# Patient Record
Sex: Male | Born: 1995 | Race: White | Hispanic: No | Marital: Single | Smoking: Current some day smoker
Health system: Southern US, Community
[De-identification: ages and names within clinical notes are randomized; demographics above are authoritative.]

## PROBLEM LIST (undated history)

## (undated) DIAGNOSIS — K219 Gastro-esophageal reflux disease without esophagitis: Secondary | ICD-10-CM

## (undated) DIAGNOSIS — F419 Anxiety disorder, unspecified: Secondary | ICD-10-CM

## (undated) DIAGNOSIS — F32A Depression, unspecified: Secondary | ICD-10-CM

## (undated) HISTORY — DX: Depression, unspecified: F32.A

## (undated) HISTORY — DX: Gastro-esophageal reflux disease without esophagitis: K21.9

## (undated) HISTORY — DX: Anxiety disorder, unspecified: F41.9

---

## 2015-08-27 ENCOUNTER — Encounter (HOSPITAL_COMMUNITY): Payer: Self-pay | Admitting: *Deleted

## 2015-08-27 ENCOUNTER — Ambulatory Visit (HOSPITAL_COMMUNITY)
Admission: EM | Admit: 2015-08-27 | Discharge: 2015-08-27 | Disposition: A | Discharge status: Home / Self Care | Payer: 59 | Attending: Family Medicine | Admitting: Family Medicine

## 2015-08-27 ENCOUNTER — Ambulatory Visit (INDEPENDENT_AMBULATORY_CARE_PROVIDER_SITE_OTHER): Payer: 59

## 2015-08-27 DIAGNOSIS — S92301A Fracture of unspecified metatarsal bone(s), right foot, initial encounter for closed fracture: Secondary | ICD-10-CM

## 2015-08-27 MED ORDER — HYDROCODONE-ACETAMINOPHEN 5-325 MG PO TABS: 1.0000 | ORAL_TABLET | ORAL | 0 refills | Status: DC | PRN

## 2015-08-27 NOTE — ED Provider Notes (Signed)
CSN: [id number]     Arrival date & time 08/27/15  1844 History   First MD Initiated Contact with Patient 08/27/15 1951     Chief Complaint  Patient presents with  . Foot Injury   (Consider location/radiation/quality/duration/timing/severity/associated sxs/prior Treatment) 20 year old male was on a skateboard yesterday, fell and inverted his right foot. He is complaining of persistent pain with swelling and tenderness along the lateral aspect of the right foot. Denies injury to the ankle. Denies falling or other injury.      History reviewed. No pertinent past medical history. History reviewed. No pertinent surgical history. History reviewed. No pertinent family history. Social History  Substance Use Topics  . Smoking status: Not on file  . Smokeless tobacco: Not on file  . Alcohol use Not on file    Review of Systems  Constitutional: Negative.   Respiratory: Negative.   Gastrointestinal: Negative.   Genitourinary: Negative.   Musculoskeletal:       As per HPI  Skin: Negative.   Neurological: Negative for dizziness, weakness, numbness and headaches.  All other systems reviewed and are negative.   Allergies  Review of patient's allergies indicates no known allergies.  Home Medications   Prior to Admission medications   Medication Sig Start Date End Date Taking? Authorizing Provider  HYDROcodone-acetaminophen (NORCO/VICODIN) 5-325 MG tablet Take 1 tablet by mouth every 4 (four) hours as needed. 08/27/15   Hayden Rasmussenavid Maninder Deboer, NP   Meds Ordered and Administered this Visit  Medications - No data to display  BP 134/62 (BP Location: Left Arm)   Pulse 64   Temp 98.1 F (36.7 C) (Oral)   Resp 12   SpO2 99%  No data found.   Physical Exam  Constitutional: He is oriented to person, place, and time. He appears well-developed and well-nourished.  HENT:  Head: Normocephalic and atraumatic.  Eyes: EOM are normal. Left eye exhibits no discharge.  Neck: Normal range of motion. Neck  supple.  Musculoskeletal:  Mild erythema, swelling and tenderness to the right dorsal lateral foot along the fourth and fifth metatarsals. No deformity. Sensation intact. Pedal pulse 2+. No ankle swelling, tenderness, discoloration or pain. Full range of motion of the ankle.  Neurological: He is alert and oriented to person, place, and time. No cranial nerve deficit.  Skin: Skin is warm and dry.  Psychiatric: He has a normal mood and affect.    Urgent Care Course   Clinical Course    Procedures (including critical care time)  Labs Review Labs Reviewed - No data to display  Imaging Review Dg Foot Complete Right  Result Date: 08/27/2015 CLINICAL DATA:  Right foot injury while skateboarding yesterday with persistent pain, initial encounter EXAM: RIGHT FOOT COMPLETE - 3+ VIEW COMPARISON:  None. FINDINGS: There is a midshaft fracture of the fifth metatarsal with only minimal displacement identified. No other fractures are seen. Associated soft tissue swelling is noted. IMPRESSION: Fifth metatarsal fracture with soft tissue swelling. Electronically Signed   By: Alcide CleverMark  Lukens M.D.   On: 08/27/2015 20:17     Visual Acuity Review  Right Eye Distance:   Left Eye Distance:   Bilateral Distance:    Right Eye Near:   Left Eye Near:    Bilateral Near:         MDM   1. Metatarsal fracture, right, closed, initial encounter    Rest, Ice, elevation, wear wrap and use shoe with ambulation. NO weight bearing. Follow with the orthopedist above. Meds ordered this encounter  Medications  . HYDROcodone-acetaminophen (NORCO/VICODIN) 5-325 MG tablet    Sig: Take 1 tablet by mouth every 4 (four) hours as needed.    Dispense:  15 tablet    Refill:  0    Order Specific Question:   Supervising Provider    Answer:   Linna Hoff [[id number]]       Hayden Rasmussen, NP 08/27/15 2038    Hayden Rasmussen, NP 08/27/15 2041

## 2015-08-27 NOTE — ED Notes (Signed)
Large   Adult  Crutches   And  XL   Male   Advertising account plannerWooden  Shoe

## 2015-08-27 NOTE — ED Triage Notes (Signed)
Pt  Reports yesterday   While  Skateboarding  Pt   Turned  His  r foot  Doing a  Maneuver he  Has  Pain  And swelling  To the affected  r foot  He  Has  Pain  When he  Puts  Weight on the  Foot

## 2015-08-27 NOTE — Discharge Instructions (Signed)
Rest, Ice, elevation, wear wrap and use shoe with ambulation. NO weight bearing. Follow with the orthopedist above.

## 2016-05-29 ENCOUNTER — Ambulatory Visit (INDEPENDENT_AMBULATORY_CARE_PROVIDER_SITE_OTHER): Payer: 59

## 2016-05-29 ENCOUNTER — Encounter (HOSPITAL_COMMUNITY): Payer: Self-pay | Admitting: Emergency Medicine

## 2016-05-29 ENCOUNTER — Ambulatory Visit (HOSPITAL_COMMUNITY)
Admission: EM | Admit: 2016-05-29 | Discharge: 2016-05-29 | Disposition: A | Discharge status: Home / Self Care | Payer: 59 | Attending: Internal Medicine | Admitting: Internal Medicine

## 2016-05-29 DIAGNOSIS — M84374G Stress fracture, right foot, subsequent encounter for fracture with delayed healing: Secondary | ICD-10-CM

## 2016-05-29 MED ORDER — IBUPROFEN 800 MG PO TABS
ORAL_TABLET | ORAL | Status: AC
  Filled 2016-05-29: qty 1

## 2016-05-29 MED ORDER — IBUPROFEN 800 MG PO TABS
800.0000 mg | ORAL_TABLET | Freq: Once | ORAL | Status: AC
  Administered 2016-05-29: 800 mg via ORAL

## 2016-05-29 NOTE — ED Triage Notes (Addendum)
Patient reports injury today.  Patient was skateboarding and injured right lateral foot.  Reports a fracture to this foot in the past and recently sprained or bruised this area.  Able to move toes, right pedal pulse 2+

## 2016-05-29 NOTE — Progress Notes (Signed)
Orthopedic Tech Progress Note Patient Details:  Howard Morris [birth year] [id number]  Ortho Devices Type of Ortho Device: Ace wrap, Post (short leg) splint Ortho Device/Splint Location: RLE Ortho Device/Splint Interventions: Ordered, Application   Jennye MoccasinHughes, Gregoria Selvy Craig 05/29/2016, 7:24 PM

## 2016-05-29 NOTE — Discharge Instructions (Addendum)
Wear splint and use crutches until followup with orthopedist.  Ice, elevate for pain.  Ibuprofen 800mg  2-3 times daily should help with pain.

## 2016-05-29 NOTE — ED Provider Notes (Signed)
[facility]    CSN: [id number] Arrival date & time: 05/29/16  1715     History   Chief Complaint Chief Complaint  Patient presents with  . Foot Pain    HPI Howard Morris is a 21 y.o. male. He broke the fifth metatarsal in his right foot last summer, seemed to mostly heal.  Reinjured the same site about a month ago, [facility] an inversion injury of his right ankle and foot. Swollen and very painful today.    HPI  History reviewed. No pertinent past medical history.  History reviewed. No pertinent surgical history.     Home Medications   Takes no meds regularly  Family History No family history on file.  Social History Social History  Substance Use Topics  . Smoking status: Current Some Day Smoker  . Smokeless tobacco: Not on file  . Alcohol use Yes     Allergies   Patient has no known allergies.   Review of Systems Review of Systems  All other systems reviewed and are negative.    Physical Exam Triage Vital Signs ED Triage Vitals  Enc Vitals Group     BP 05/29/16 1751 136/80     Pulse Rate 05/29/16 1751 (!) 104     Resp 05/29/16 1751 18     Temp 05/29/16 1751 98.9 F (37.2 C)     Temp Source 05/29/16 1751 Oral     SpO2 05/29/16 1751 97 %     Weight --      Height --      Pain Score 05/29/16 1746 7     Pain Loc --    Updated Vital Signs BP 136/80 (BP Location: Right Arm)   Pulse (!) 104   Temp 98.9 F (37.2 C) (Oral)   Resp 18   SpO2 97%   Physical Exam  Constitutional: He is oriented to person, place, and time. No distress.  Alert, nicely groomed  HENT:  Head: Atraumatic.  Eyes:  Conjugate gaze, no eye redness/drainage  Neck: Neck supple.  Cardiovascular: Normal rate.   Pulmonary/Chest: No respiratory distress.  Abdominal: He exhibits no distension.  Musculoskeletal: Normal range of motion.  Right foot dorsal laterally is quite swollen, with focal bony tenderness in the mid fifth  metatarsal area.  Neurological: He is alert and oriented to person, place, and time.  Skin: Skin is warm and dry.  No cyanosis  Nursing note and vitals reviewed.    UC Treatments / Results   Radiology Dg Foot Complete Right  Result Date: 05/29/2016 CLINICAL DATA:  Acute right foot pain following skateboarding injury today. Initial encounter. History of fifth metatarsal fracture in August 2017. EXAM: RIGHT FOOT COMPLETE - 3+ VIEW COMPARISON:  08/27/2015 FINDINGS: A remote fracture of the mid fifth metatarsal is again noted. The fracture line appears wider since 2017. Healing changes are noted. Lateral soft tissue swelling is noted. No other fracture, subluxation or dislocation identified. IMPRESSION: Remote fracture of the fifth metatarsal noted but the fracture line is now slightly wider wider and there is overlying soft tissue swelling. Re-injury at the fracture site is not entirely excluded. Electronically Signed   By: Harmon PierJeffrey  Hu M.D.   On: 05/29/2016 18:12    Procedures Procedures (including critical care time)  Medications Ordered in UC Medications  ibuprofen (ADVIL,MOTRIN) tablet 800 mg (800 mg Oral Given 05/29/16 1858)   Posterior/stirrup splint applied by ortho tech.  Has crutches.   Final  Clinical Impressions(s) / UC Diagnoses   Final diagnoses:  Metatarsal stress fracture with delayed healing, right   Wear splint and use crutches until followup with orthopedist.  Ice, elevate for pain.  Ibuprofen 800mg  2-3 times daily should help with pain.     Eustace Moore, MD 06/01/16 1058

## 2016-06-04 ENCOUNTER — Ambulatory Visit (INDEPENDENT_AMBULATORY_CARE_PROVIDER_SITE_OTHER): Payer: 59 | Admitting: Family

## 2016-06-04 DIAGNOSIS — S92351A Displaced fracture of fifth metatarsal bone, right foot, initial encounter for closed fracture: Secondary | ICD-10-CM

## 2016-06-04 HISTORY — DX: Displaced fracture of fifth metatarsal bone, right foot, initial encounter for closed fracture: S92.351A

## 2016-06-04 NOTE — Progress Notes (Signed)
   Office Visit Note   Patient: Howard Morris           Date of Birth: 29-May-1995           MRN: [id number] Visit Date: 06/04/2016              Requested by: No referring provider defined for this encounter. PCP: Patient, No Pcp Per  Chief Complaint  Patient presents with  . Right Foot - Fracture    Right 5th MT fracture. Repeat fracture from 08/2015      HPI: The patient is a 21 year old gentleman who presents today complaining of him right lateral foot pain. He did have a fracture back in August of last year to the fifth metatarsal on the right as well. States that a little over a month ago he hurt his foot fell he may have refractured it. States he did not seek treatment at that time. Try to lay low for a few weeks when the pain resolved he return to skateboarding. States he then had a inversion injury to his him same foot. Radiographs from the emergency department show repeat fracture to his fifth metatarsal shaft.  Mother reports that he has already removed his posterior splint a few times due to itching and pain. Evidence that he is walking in the posterior splint as well.  Assessment & Plan: Visit Diagnoses:  1. Closed displaced fracture of fifth metatarsal bone of right foot, initial encounter     Plan: We'll place him in a short leg cast. He'll follow-up in office in 3 weeks for repeat radiographs. Hope that this will heal up uneventfully.  Follow-Up Instructions: Return in about 3 weeks (around 06/25/2016).   Ortho Exam  Patient is alert, oriented, no adenopathy, well-dressed, normal affect, normal respiratory effort.  mild soft tissue swelling and to the lateral right foot. Cap refill is brisk. Able to wiggle his toes.  Imaging: No results found.  Labs: No results found for: HGBA1C, ESRSEDRATE, CRP, LABURIC, REPTSTATUS, GRAMSTAIN, CULT, LABORGA  Orders:  No orders of the defined types were placed in this encounter.  No orders of the defined types were placed in this  encounter.    Procedures: No procedures performed  Clinical Data: No additional findings.  ROS:  All other systems negative, except as noted in the HPI. Review of Systems  Constitutional: Negative for chills and fever.  Musculoskeletal: Positive for arthralgias.  Skin: Negative for wound.  Neurological: Negative for weakness and numbness.    Objective: Vital Signs: There were no vitals taken for this visit.  Specialty Comments:  No specialty comments available.  PMFS History: Patient Active Problem List   Diagnosis Date Noted  . Closed displaced fracture of fifth metatarsal bone of right foot 06/04/2016   No past medical history on file.  No family history on file.  No past surgical history on file. Social History   Occupational History  . Not on file.   Social History Main Topics  . Smoking status: Current Some Day Smoker  . Smokeless tobacco: Not on file  . Alcohol use Yes  . Drug use: No  . Sexual activity: Not on file

## 2016-06-25 ENCOUNTER — Ambulatory Visit (INDEPENDENT_AMBULATORY_CARE_PROVIDER_SITE_OTHER): Payer: 59 | Admitting: Family

## 2016-06-30 ENCOUNTER — Ambulatory Visit (INDEPENDENT_AMBULATORY_CARE_PROVIDER_SITE_OTHER): Payer: 59

## 2016-06-30 ENCOUNTER — Ambulatory Visit (INDEPENDENT_AMBULATORY_CARE_PROVIDER_SITE_OTHER): Payer: 59 | Admitting: Family

## 2016-06-30 DIAGNOSIS — S92351D Displaced fracture of fifth metatarsal bone, right foot, subsequent encounter for fracture with routine healing: Secondary | ICD-10-CM

## 2016-06-30 DIAGNOSIS — M79671 Pain in right foot: Secondary | ICD-10-CM

## 2016-06-30 NOTE — Progress Notes (Signed)
   Office Visit Note   Patient: Howard Morris           Date of Birth: 12/04/1995           MRN: [id number] Visit Date: 06/30/2016              Requested by: No referring provider defined for this encounter. PCP: Patient, No Pcp Per  Chief Complaint  Patient presents with  . Right Foot - Follow-up    Repeat right foot 5th MT fx DOI 05/29/16      HPI: The patient is a 21 year old gentleman who presents today in follow up for a fifth metatarsal fracture, right foot from skate boarding injury. Did have history of previous fracture to 5th MT same foot also from skateboarding.  Has been in a SLC for the last 3 weeks. States is pain free. Has been putting some weight on foot in cast, as noted from cast wear.   Assessment & Plan: Visit Diagnoses:  1. Closed displaced fracture of fifth metatarsal bone of right foot with routine healing, subsequent encounter   2. Right foot pain     Plan: Placed in a post op shoe today. Use crutches if pain with weight bearing. If pain free may advance to regular shoe wear next week. No skate boarding for 4 more weeks.   Follow-Up Instructions: Return in about 3 weeks (around 07/21/2016), or if symptoms worsen or fail to improve.   Ortho Exam  Patient is alert, oriented, no adenopathy, well-dressed, normal affect, normal respiratory effort.  mild soft tissue swelling and to the lateral right foot. 5th MT is nontender. Cap refill is brisk. Able to wiggle his toes.  Imaging: Xr Foot 2 Views Right  Result Date: 06/30/2016 Radiographs of right foot show interval callus formation of fifth metatarsal fracture.    Labs: No results found for: HGBA1C, ESRSEDRATE, CRP, LABURIC, REPTSTATUS, GRAMSTAIN, CULT, LABORGA  Orders:  Orders Placed This Encounter  Procedures  . XR Foot 2 Views Right   No orders of the defined types were placed in this encounter.    Procedures: No procedures performed  Clinical Data: No additional findings.  ROS:  All other  systems negative, except as noted in the HPI. Review of Systems  Constitutional: Negative for chills and fever.  Musculoskeletal: Positive for arthralgias.  Skin: Negative for wound.  Neurological: Negative for weakness and numbness.    Objective: Vital Signs: There were no vitals taken for this visit.  Specialty Comments:  No specialty comments available.  PMFS History: Patient Active Problem List   Diagnosis Date Noted  . Closed displaced fracture of fifth metatarsal bone of right foot 06/04/2016   No past medical history on file.  No family history on file.  No past surgical history on file. Social History   Occupational History  . Not on file.   Social History Main Topics  . Smoking status: Current Some Day Smoker  . Smokeless tobacco: Not on file  . Alcohol use Yes  . Drug use: No  . Sexual activity: Not on file

## 2016-07-29 ENCOUNTER — Ambulatory Visit (INDEPENDENT_AMBULATORY_CARE_PROVIDER_SITE_OTHER): Payer: 59 | Admitting: Family

## 2018-05-25 IMAGING — DX DG FOOT COMPLETE 3+V*R*
3 series · 3 of 3 positions shown · non-contrast
Comparison: None.

CLINICAL DATA: Right foot injury while skateboarding yesterday with
persistent pain, initial encounter

EXAM:
RIGHT FOOT COMPLETE - 3+ VIEW

[foot ap]
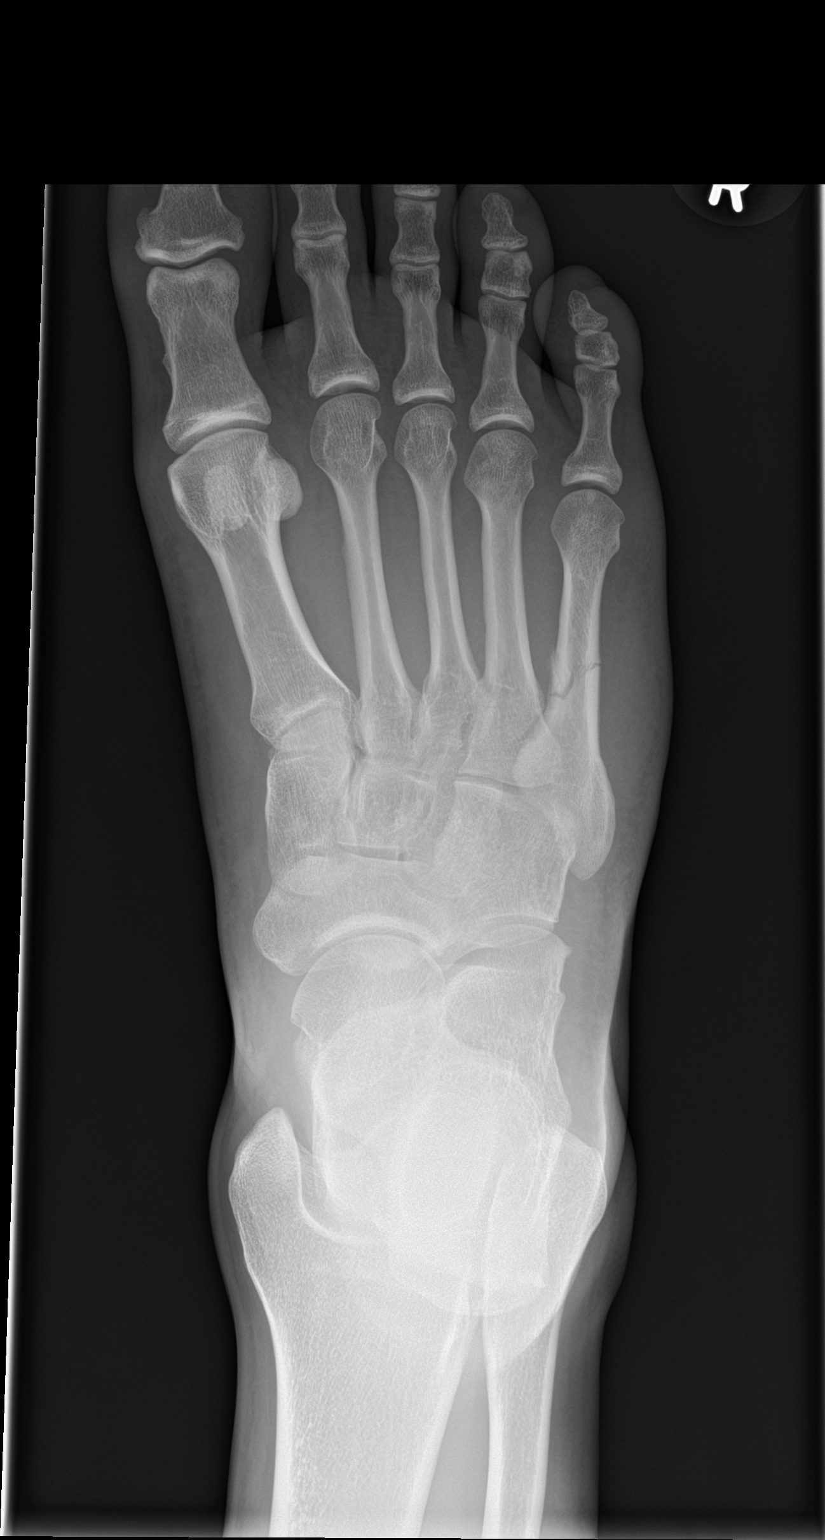

[foot obl]
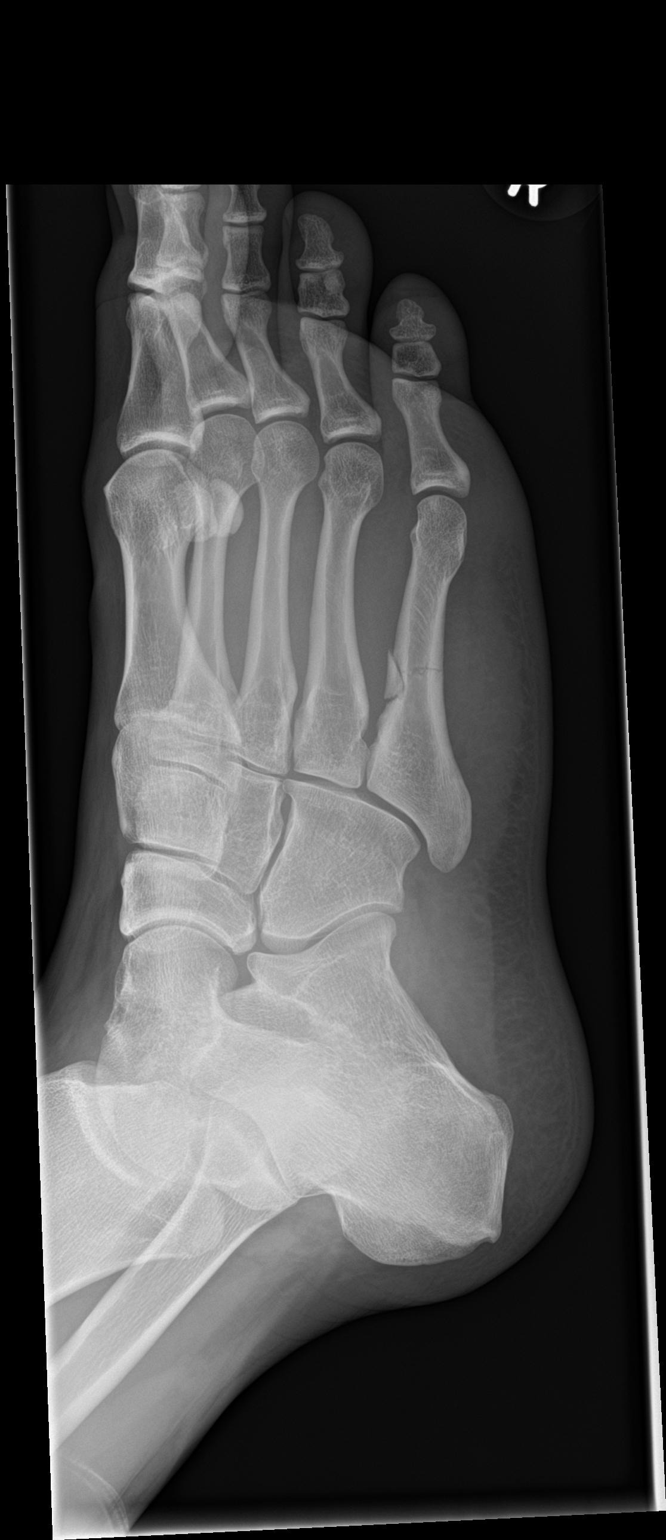

[foot lat]
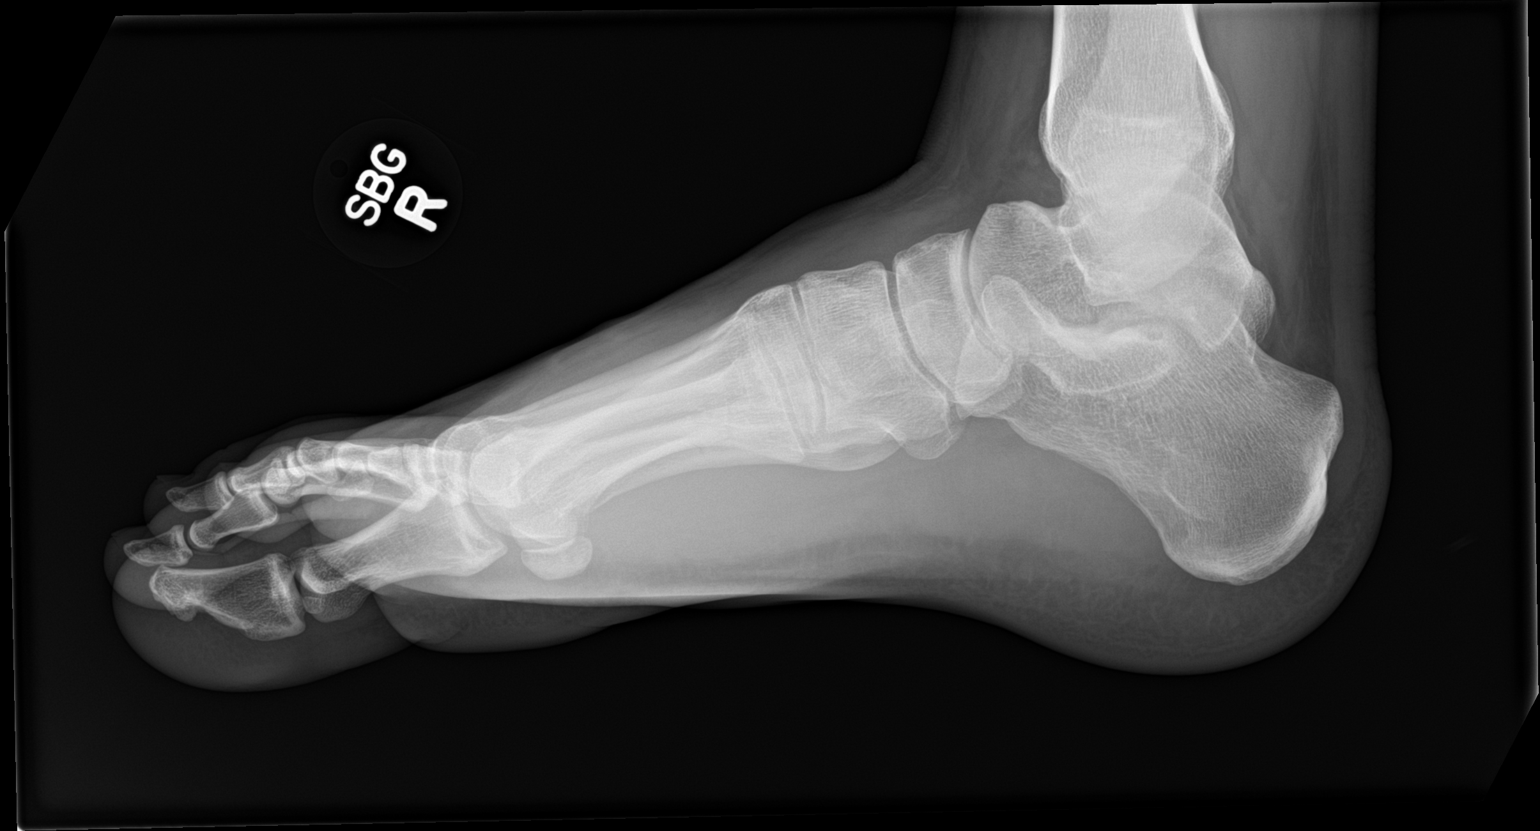

[3 of 3 positions shown; findings below may reference images not displayed]

FINDINGS: There is a midshaft fracture of the fifth metatarsal with only
minimal displacement identified. No other fractures are seen.
Associated soft tissue swelling is noted.
IMPRESSION: Fifth metatarsal fracture with soft tissue swelling.

## 2019-02-25 IMAGING — DX DG FOOT COMPLETE 3+V*R*
3 series · 3 of 3 positions shown · non-contrast
Comparison: 08/27/2015

CLINICAL DATA: Acute right foot pain following skateboarding injury
today. Initial encounter. History of fifth metatarsal fracture in
August 2015.

EXAM:
RIGHT FOOT COMPLETE - 3+ VIEW

[foot ap]
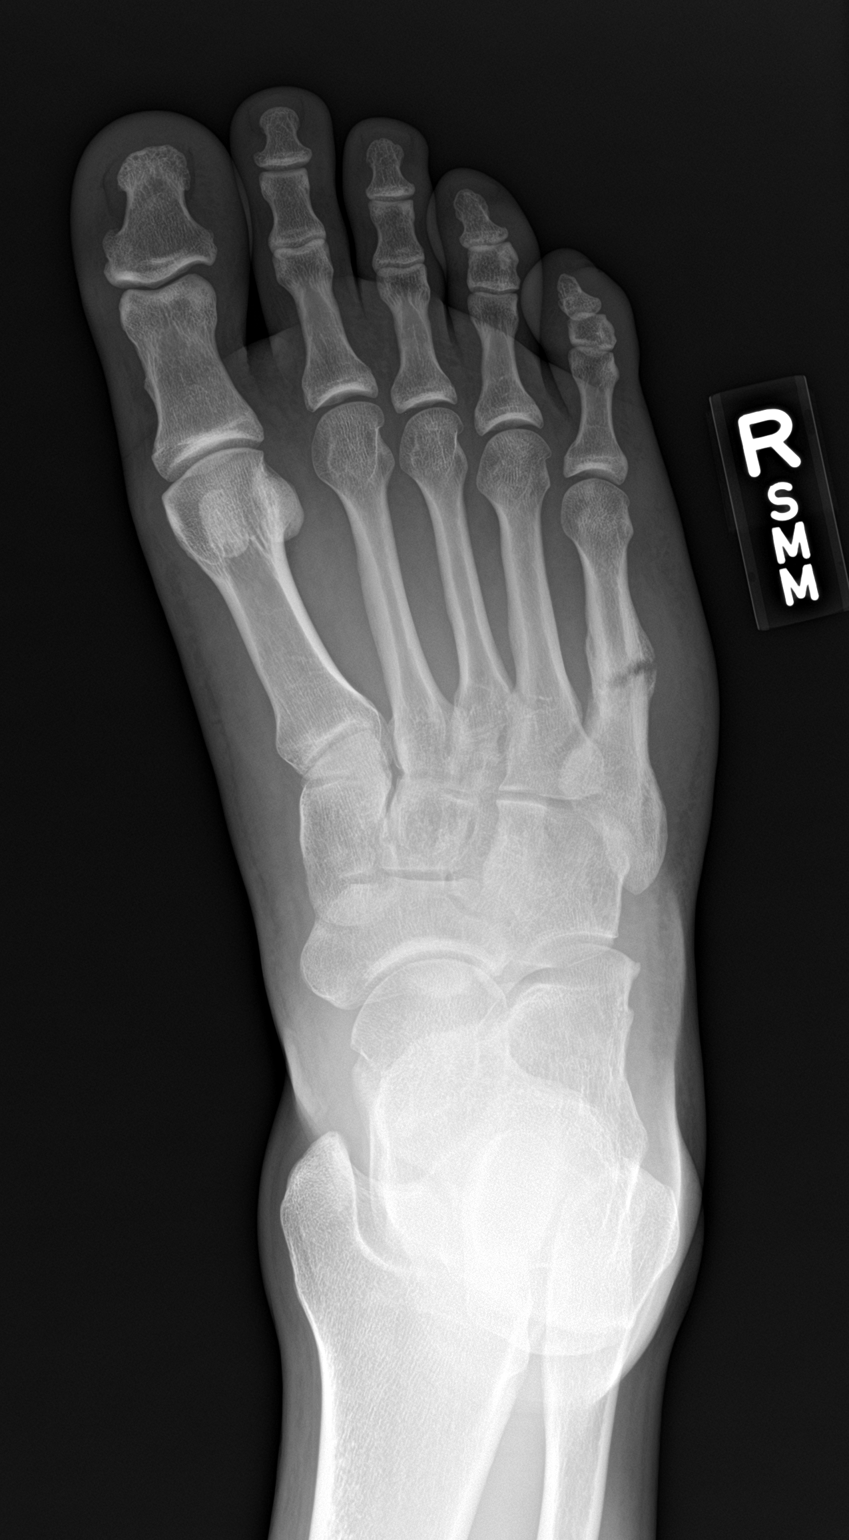

[foot obl]
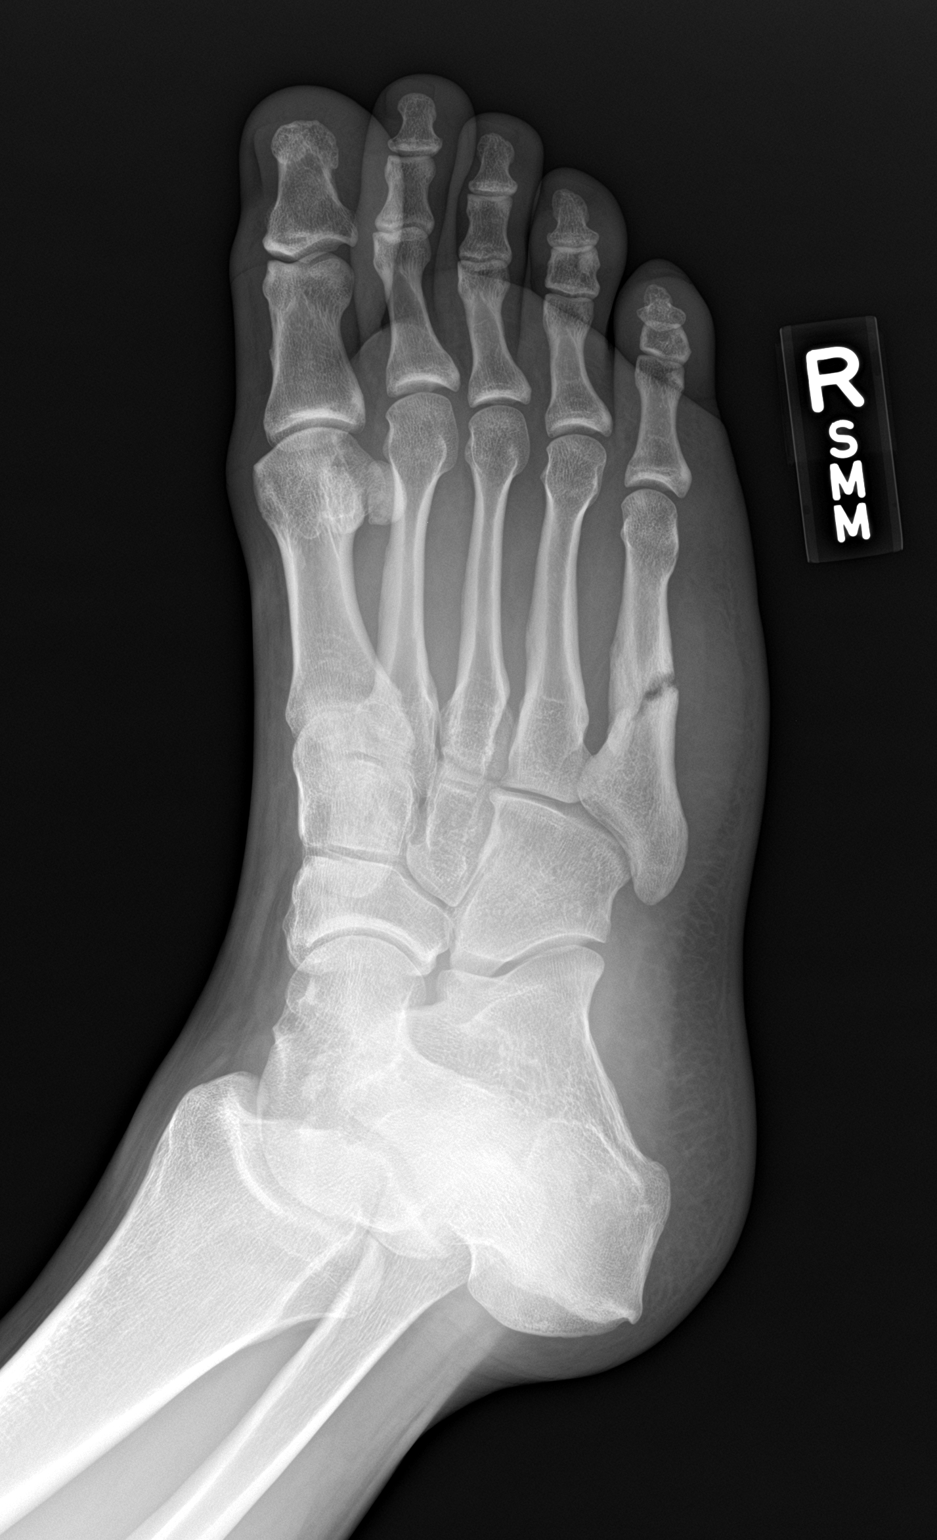

[foot lat]
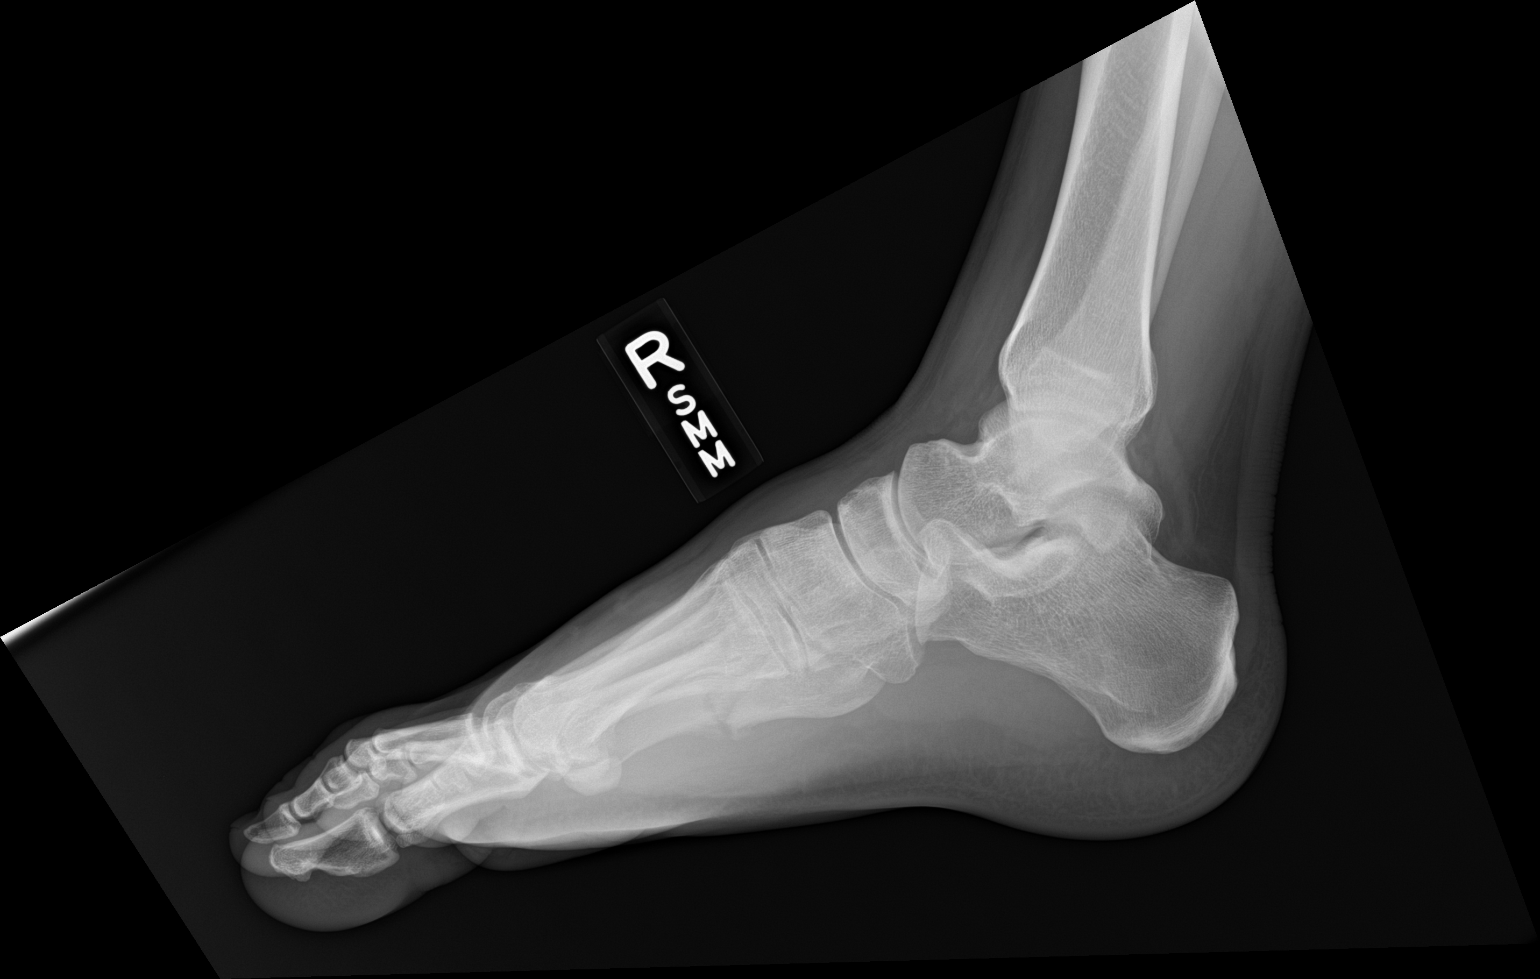

[3 of 3 positions shown; findings below may reference images not displayed]

FINDINGS: A remote fracture of the mid fifth metatarsal is again noted. The
fracture line appears wider since 0635. Healing changes are noted.

Lateral soft tissue swelling is noted.

No other fracture, subluxation or dislocation identified.
IMPRESSION: Remote fracture of the fifth metatarsal noted but the fracture line
is now slightly wider wider and there is overlying soft tissue
swelling. Re-injury at the fracture site is not entirely excluded.

## 2021-03-12 ENCOUNTER — Ambulatory Visit: Payer: No Typology Code available for payment source | Admitting: Family

## 2021-03-12 ENCOUNTER — Other Ambulatory Visit: Payer: Self-pay

## 2021-03-12 ENCOUNTER — Encounter: Payer: Self-pay | Admitting: Family

## 2021-03-12 VITALS — BP 146/80 | HR 88 | Temp 98.4°F | Ht 73.23 in | Wt 227.2 lb

## 2021-03-12 DIAGNOSIS — F419 Anxiety disorder, unspecified: Secondary | ICD-10-CM | POA: Diagnosis not present

## 2021-03-12 DIAGNOSIS — R030 Elevated blood-pressure reading, without diagnosis of hypertension: Secondary | ICD-10-CM

## 2021-03-12 DIAGNOSIS — F900 Attention-deficit hyperactivity disorder, predominantly inattentive type: Secondary | ICD-10-CM | POA: Diagnosis not present

## 2021-03-12 MED ORDER — METHYLPHENIDATE HCL ER (OSM) 18 MG PO TBCR: 18.0000 mg | EXTENDED_RELEASE_TABLET | Freq: Every day | ORAL | 0 refills | Status: DC

## 2021-03-12 MED ORDER — HYDROXYZINE HCL 10 MG PO TABS: 10.0000 mg | ORAL_TABLET | Freq: Three times a day (TID) | ORAL | 0 refills | Status: AC | PRN

## 2021-03-12 NOTE — Patient Instructions (Addendum)
Welcome to [facility]! It was a pleasure meeting you today.  As discussed, I have sent your medication to your pharmacy, for your ADHD and anxiety. Keep taking with mild side effects, but anything serious let me know via MyChart message.  Please schedule a 1 month follow up visit today.    PLEASE NOTE:  If you had any LAB tests please let us know if you have not heard back within a few days. You may see your results on MyChart before we have a chance to review them but we will give you a call once they are reviewed by [place]. If we ordered any REFERRALS today, please let us know if you have not heard from their office within the next week.  Let us know through MyChart if you are needing REFILLS, or have your pharmacy send [place] the request. You can also use MyChart to communicate with me or any office staff.  Please try these tips to maintain a healthy lifestyle:  Eat most of your calories during the day when you are active. Eliminate processed foods including packaged sweets (pies, cakes, cookies), reduce intake of potatoes, white bread, white pasta, and white rice. Look for whole grain options, oat flour or almond flour.  Each meal should contain half fruits/vegetables, one quarter protein, and one quarter carbs (no bigger than a computer mouse).  Cut down on sweet beverages. This includes juice, soda, and sweet tea. Also watch fruit intake, though this is a healthier sweet option, it still contains natural sugar! Limit to 3 servings daily.  Drink at least 1 glass of water with each meal and aim for at least 8 glasses per day  Exercise at least 150 minutes every week.

## 2021-03-12 NOTE — Progress Notes (Signed)
New Patient Office Visit  Subjective:  Patient ID: Howard Morris, male    DOB: 18-Nov-1995  Age: 26 y.o. MRN: [id number]  CC:  Chief Complaint  Patient presents with   Establish Care   Anxiety   Depression   ADHD    Pt states that he has a hard time focusing when working and driving. He mentions having this problem since 7th grade.     HPI [name] presents for establishing care and to discuss a couple of problems. ADHD: Presenting symptoms: difficulty focusing, easily distracted, forgetful. Age of Onset: middle school. Symptom Duration: about a year. Degree of functional impairment: at home: mild work: severe - reports having a major panic attack at work and had to leave. Coexisting conditions?  Anxiety .Social Stressors:  trying to look for new work. h/o abuse: no. Family stressors: no. Anxiety/Depression: Patient complains of anxiety disorder.   He has the following symptoms: difficulty concentrating, feelings of losing control, irritable, palpitations, racing thoughts, sweating.  Onset of symptoms was approximately years ago, He denies current suicidal and homicidal ideation.  Possible organic causes contributing are: none.  Risk factors: none  Previous treatment includes nothing and no counseling.   Depression screen [facility] 2/9 03/12/2021  Decreased Interest 3  Down, Depressed, Hopeless 2  PHQ - 2 Score 5  Altered sleeping 3  Tired, decreased energy 3  Change in appetite 3  Feeling bad or failure about yourself  3  Trouble concentrating 2  Moving slowly or fidgety/restless 3  Suicidal thoughts 1  PHQ-9 Score 23  Difficult doing work/chores Extremely dIfficult   GAD 7 : Generalized Anxiety Score 03/12/2021  Nervous, Anxious, on Edge 3  Control/stop worrying 3  Worry too much - different things 3  Trouble relaxing 3  Restless 3  Easily annoyed or irritable 2  Afraid - awful might happen 1  Total GAD 7 Score 18  Anxiety Difficulty Very difficult       Past Medical  History:  Diagnosis Date   Anxiety    Depression    GERD (gastroesophageal reflux disease)     History reviewed. No pertinent surgical history.  Family History  Problem Relation Age of Onset   ADD / ADHD Mother    Anxiety disorder Mother    Depression Mother    Heart disease Mother    Alcohol abuse Father    Anxiety disorder Father    Drug abuse Father    Heart disease Father    Diabetes Maternal Grandfather    ADD / ADHD Brother    Anxiety disorder Brother    Depression Brother    Drug abuse Brother    Learning disabilities Brother    ADD / ADHD Brother    Alcohol abuse Brother    Anxiety disorder Brother    Depression Brother    Drug abuse Brother    Learning disabilities Brother    Alcohol abuse Paternal Uncle     Social History   Socioeconomic History   Marital status: Single    Spouse name: Not on file   Number of children: Not on file   Years of education: Not on file   Highest education level: Not on file  Occupational History   Not on file  Tobacco Use   Smoking status: Some Days    Types: E-cigarettes   Smokeless tobacco: Not on file  Substance and Sexual Activity   Alcohol use: Not Currently   Drug use: Never   Sexual activity:  Yes    Birth control/protection: None  Other Topics Concern   Not on file  Social History Narrative   Not on file   Social Determinants of Health   Financial Resource Strain: Not on file  Food Insecurity: Not on file  Transportation Needs: Not on file  Physical Activity: Not on file  Stress: Not on file  Social Connections: Not on file  Intimate Partner Violence: Not on file    Objective:   Today's Vitals: BP (!) 146/80 Comment: Pt is nervous.   Pulse 88    Temp 98.4 F (36.9 C) (Temporal)    Ht 6' 1.23" (1.86 m)    Wt 227 lb 3.2 oz (103.1 kg)    SpO2 97%    BMI 29.79 kg/m   Physical Exam Vitals and nursing note reviewed.  Constitutional:      General: He is not in acute  distress.    Appearance: Normal appearance.  HENT:     Head: Normocephalic.  Cardiovascular:     Rate and Rhythm: Normal rate and regular rhythm.  Pulmonary:     Effort: Pulmonary effort is normal.     Breath sounds: Normal breath sounds.  Musculoskeletal:        General: Normal range of motion.     Cervical back: Normal range of motion.  Skin:    General: Skin is warm and dry.  Neurological:     Mental Status: He is alert and oriented to person, place, and time.  Psychiatric:        Mood and Affect: Mood normal.   Assessment & Plan:   Problem List Items Addressed This Visit       Other   ADHD (attention deficit hyperactivity disorder), inattentive type - Primary    NEW - pt reports having since childhood in conjunction with anxiety, but never evaluated. did not graduate but received his GED. Now noticing worsening sx with trying to work. self-medicating with extra caffeine, energy drinks. Screening tool completed congruent w/DX. Starting Concerta, advised on use & SE, stop ALL caffeine sources, f/u in 1 month. Also discussed lifestyle changes to help sx and handout provided to reinforce.      Relevant Medications   methylphenidate (CONCERTA) 18 MG PO CR tablet   Anxiety disorder    NEW - reports having since childhood, never evaluated. States he bites his nails to the quick, gets very hyped up, palpitations, but also admits to a lot of caffeine due to self medicating his ADHD sx. Will start tx for ADHD and see if this lessens his anxiety. Hydroxyzine started prn, advised on use & SE.      Relevant Medications   hydrOXYzine (ATARAX) 10 MG tablet   Other Visit Diagnoses     Elevated blood pressure reading    - believe related to his anxiety. Advised on drinking at least 64oz water qd and low sodium diet. Will continue to monitor.      Outpatient Encounter Medications as of 03/12/2021  Medication Sig   hydrOXYzine (ATARAX) 10 MG tablet Take 1 tablet (10 mg total) by mouth  3 (three) times daily as needed.   methylphenidate (CONCERTA) 18 MG PO CR tablet Take 1 tablet (18 mg total) by mouth daily.   No facility-administered encounter medications on file as of 03/12/2021.    Follow-up: Return in about 4 weeks (around 04/09/2021) for adhd, anxiety.   Jeanie Sewer, NP

## 2021-03-12 NOTE — Assessment & Plan Note (Signed)
NEW - reports having since childhood, never evaluated. States he bites his nails to the quick, gets very hyped up, palpitations, but also admits to a lot of caffeine due to self medicating his ADHD sx. Will start tx for ADHD and see if this lessens his anxiety. Hydroxyzine started prn, advised on use & SE.

## 2021-03-12 NOTE — Assessment & Plan Note (Addendum)
NEW - pt reports having since childhood in conjunction with anxiety, but never evaluated. did not graduate but received his GED. Now noticing worsening sx with trying to work. self-medicating with extra caffeine, energy drinks. Screening tool completed congruent w/DX. Starting Concerta, advised on use & SE, stop ALL caffeine sources, f/u in 1 month. Also discussed lifestyle changes to help sx and handout provided to reinforce.

## 2021-03-25 ENCOUNTER — Ambulatory Visit: Payer: 59 | Admitting: Family

## 2021-04-02 ENCOUNTER — Encounter: Payer: Self-pay | Admitting: Family

## 2021-04-02 ENCOUNTER — Other Ambulatory Visit: Payer: Self-pay

## 2021-04-02 ENCOUNTER — Telehealth: Payer: Self-pay | Admitting: Family

## 2021-04-02 DIAGNOSIS — F900 Attention-deficit hyperactivity disorder, predominantly inattentive type: Secondary | ICD-10-CM

## 2021-04-02 NOTE — Telephone Encounter (Signed)
see mychart msg - pt needs a visit

## 2021-04-02 NOTE — Progress Notes (Deleted)
? ?  Subjective:  ? ? ? Patient ID: Howard Morris, male    DOB: [birth year], 26 y.o.   MRN: [id number] ? ?No chief complaint on file. ? ? ?HPI: ?ADHD: Presenting symptoms: difficulty focusing, easily distracted, forgetful. Age of Onset: middle school. Symptom Duration: about a year. Degree of functional impairment: at home: mild work: severe - reports having a major panic attack at work and had to leave. Coexisting conditions?  Anxiety .Social Stressors:  trying to look for new work. h/o abuse: no. Family stressors: no. ?Anxiety/Depression: Patient complains of anxiety disorder.   ?He has the following symptoms: difficulty concentrating, feelings of losing control, irritable, palpitations, racing thoughts, sweating.  ?Onset of symptoms was approximately years ago, He denies current suicidal and homicidal ideation.  ?Possible organic causes contributing are: none.  ?Risk factors: none  ?Previous treatment includes nothing and no counseling.   ? ? ?Health Maintenance Due  ?Topic Date Due  ? HPV VACCINES (1 - Male 2-dose series) Never done  ? HIV Screening  Never done  ? Hepatitis C Screening  Never done  ? TETANUS/TDAP  Never done  ? ? ?Past Medical History:  ?Diagnosis Date  ? Anxiety   ? Depression   ? GERD (gastroesophageal reflux disease)   ? ? ?No past surgical history on file. ? ?Outpatient Medications Prior to Visit  ?Medication Sig Dispense Refill  ? hydrOXYzine (ATARAX) 10 MG tablet Take 1 tablet (10 mg total) by mouth 3 (three) times daily as needed. 30 tablet 0  ? methylphenidate (CONCERTA) 18 MG PO CR tablet Take 1 tablet (18 mg total) by mouth daily. 30 tablet 0  ? ?No facility-administered medications prior to visit.  ? ? ?No Known Allergies ? ? ?   ?Objective:  ?  ?Physical Exam ? ?There were no vitals taken for this visit. ?Wt Readings from Last 3 Encounters:  ?03/12/21 227 lb 3.2 oz (103.1 kg)  ? ? ?   ?Assessment & Plan:  ? ?Problem List Items Addressed This Visit   ?None ? ? ?No orders of the defined types  were placed in this encounter. ? ? ?Dulce Sellar, NP ? ?

## 2021-04-02 NOTE — Telephone Encounter (Signed)
.. ?  Encourage patient to contact the pharmacy for refills or they can request refills through [facility] ? ?LAST APPOINTMENT DATE:  03/12/21 ? ?NEXT APPOINTMENT DATE: 04/03/21 ? ?MEDICATION:methylphenidate (CONCERTA) 18 MG PO CR tablet ? ?Is the patient out of medication? Yes ? ?PHARMACY: ?[id number] - [place], [place] - [address] AT [facility] OF [address] [facility] [address] & [facility] [address] Phone:  [phone]  ?Fax:  [phone]  ?  ? ? ?Let patient know to contact pharmacy at the end of the day to make sure medication is ready. ? ?Please notify patient to allow 48-72 hours to process  ?

## 2021-04-03 ENCOUNTER — Telehealth: Payer: Self-pay

## 2021-04-03 ENCOUNTER — Encounter: Payer: Self-pay | Admitting: Family

## 2021-04-03 ENCOUNTER — Telehealth (INDEPENDENT_AMBULATORY_CARE_PROVIDER_SITE_OTHER): Payer: No Typology Code available for payment source | Admitting: Family

## 2021-04-03 VITALS — Ht 73.0 in | Wt 227.0 lb

## 2021-04-03 DIAGNOSIS — F900 Attention-deficit hyperactivity disorder, predominantly inattentive type: Secondary | ICD-10-CM | POA: Diagnosis not present

## 2021-04-03 DIAGNOSIS — F419 Anxiety disorder, unspecified: Secondary | ICD-10-CM | POA: Diagnosis not present

## 2021-04-03 MED ORDER — METHYLPHENIDATE HCL ER (OSM) 36 MG PO TBCR: 36.0000 mg | EXTENDED_RELEASE_TABLET | Freq: Every day | ORAL | 0 refills | Status: AC

## 2021-04-03 MED ORDER — METHYLPHENIDATE HCL ER (OSM) 36 MG PO TBCR: 36.0000 mg | EXTENDED_RELEASE_TABLET | Freq: Every day | ORAL | 0 refills | Status: DC

## 2021-04-03 NOTE — Telephone Encounter (Signed)
Please follow up with patient at [phone].

## 2021-04-03 NOTE — Telephone Encounter (Signed)
Pt has a appointment scheduled for today on 04/03/2021.  ?

## 2021-04-03 NOTE — Assessment & Plan Note (Addendum)
doing well on Concerta, but would like to increase dose to 36mg , advised to f/u in 3 mos. ?

## 2021-04-03 NOTE — Assessment & Plan Note (Signed)
Better since starting ADHD med, feels less anxiety. Taking 2 Hydroxyzine when needed. ?

## 2021-04-03 NOTE — Telephone Encounter (Signed)
I called to speak with patient in regards to his pharmacy he would like his medication sent to. Lvm for patient to call back. ?

## 2021-04-03 NOTE — Progress Notes (Signed)
? ? ?MyChart Video Visit ? ? ? ?Virtual Visit via Video Note  ? ?This visit type was conducted due to national recommendations for restrictions regarding the COVID-19 Pandemic (e.g. social distancing) in an effort to limit this patient's exposure and mitigate transmission in our community. This patient is at least at moderate risk for complications without adequate follow up. This format is felt to be most appropriate for this patient at this time. Physical exam was limited by quality of the video and audio technology used for the visit. CMA was able to get the patient set up on a video visit. ? ?Patient location: Home. Patient and provider in visit ?Provider location: Office ? ?I discussed the limitations of evaluation and management by telemedicine and the availability of in person appointments. The patient expressed understanding and agreed to proceed. ? ?Visit Date: 04/03/2021 ? ?Today's healthcare provider: Jeanie Sewer, NP  ? ? ? ?Subjective:  ? ? Patient ID: Howard Morris, male    DOB: [birth year], 26 y.o.   MRN: [id number] ? ?Chief Complaint  ?Patient presents with  ? ADHD  ?  Pt states it is going well. He has started taking 2 a day and would like to know if he could get a higher dose of concerta.  ? Anxiety  ? ?HPI ?ADHD: Presenting symptoms: difficulty focusing, easily distracted, forgetful. Age of Onset: middle school. Symptom Duration: about a year. Degree of functional impairment: at home: mild work: severe - reports having a major panic attack at work and had to leave. Coexisting conditions?  Anxiety .Social Stressors:  trying to look for new work. h/o abuse: no. Family stressors: no. ?Anxiety/Depression: Patient complains of anxiety disorder.   ?He has the following symptoms: difficulty concentrating, feelings of losing control, irritable, palpitations, racing thoughts, sweating.  ?Onset of symptoms was approximately years ago, He denies current suicidal and homicidal ideation.  ?Possible organic  causes contributing are: none.  ?Risk factors: none  ?Previous treatment includes nothing and no counseling.   ? ?Past Medical History:  ?Diagnosis Date  ? Anxiety   ? Closed displaced fracture of fifth metatarsal bone of right foot 06/04/2016  ? Depression   ? GERD (gastroesophageal reflux disease)   ? ?History reviewed. No pertinent surgical history. ? ?Outpatient Medications Prior to Visit  ?Medication Sig Dispense Refill  ? hydrOXYzine (ATARAX) 10 MG tablet Take 1 tablet (10 mg total) by mouth 3 (three) times daily as needed. 30 tablet 0  ? methylphenidate (CONCERTA) 18 MG PO CR tablet Take 1 tablet (18 mg total) by mouth daily. 30 tablet 0  ? ?No facility-administered medications prior to visit.  ? ? ?No Known Allergies ? ?   ?Objective:  ?  ? ?Physical Exam ?Vitals and nursing note reviewed.  ?Constitutional:   ?   General: She is not in acute distress. ?   Appearance: Normal appearance.  ?HENT:  ?   Head: Normocephalic.  ?Pulmonary:  ?   Effort: No respiratory distress.  ?Musculoskeletal:  ?   Cervical back: Normal range of motion.  ?Skin: ?   General: Skin is dry.  ?   Coloration: Skin is not pale.  ?Neurological:  ?   Mental Status: She is alert and oriented to person, place, and time.  ?Psychiatric:     ?   Mood and Affect: Mood normal.  ? ?Ht 6\' 1"  (1.854 m)   Wt 227 lb (103 kg)   BMI 29.95 kg/m?  ? ?Wt Readings from Last 3 Encounters:  ?  04/03/21 227 lb (103 kg)  ?03/12/21 227 lb 3.2 oz (103.1 kg)  ? ? ?   ?Assessment & Plan:  ? ?Problem List Items Addressed This Visit   ? ?  ? Other  ? ADHD (attention deficit hyperactivity disorder), inattentive type - Primary  ?  doing well on Concerta, but would like to increase dose to 36mg , advised to f/u in 3 mos. ?  ?  ? Relevant Medications  ? methylphenidate (CONCERTA) 36 MG PO CR tablet  ? methylphenidate (CONCERTA) 36 MG PO CR tablet (Start on 05/03/2021)  ? methylphenidate (CONCERTA) 36 MG PO CR tablet (Start on 06/02/2021)  ? Anxiety disorder  ?  Better since  starting ADHD med, feels less anxiety. Taking 2 Hydroxyzine when needed. ?  ?  ? ? ?Meds ordered this encounter  ?Medications  ? methylphenidate (CONCERTA) 36 MG PO CR tablet  ?  Sig: Take 1 tablet (36 mg total) by mouth daily before breakfast.  ?  Dispense:  30 tablet  ?  Refill:  0  ?  Order Specific Question:   Supervising Provider  ?  Answer:   ANDY, CAMILLE L [[id number]]  ? methylphenidate (CONCERTA) 36 MG PO CR tablet  ?  Sig: Take 1 tablet (36 mg total) by mouth daily before breakfast.  ?  Dispense:  30 tablet  ?  Refill:  0  ?  Order Specific Question:   Supervising Provider  ?  Answer:   ANDY, CAMILLE L [[id number]]  ? methylphenidate (CONCERTA) 36 MG PO CR tablet  ?  Sig: Take 1 tablet (36 mg total) by mouth daily before breakfast.  ?  Dispense:  30 tablet  ?  Refill:  0  ?  Order Specific Question:   Supervising Provider  ?  Answer:   ANDY, CAMILLE L [[id number]]  ? ? ?I discussed the assessment and treatment plan with the patient. The patient was provided an opportunity to ask questions and all were answered. The patient agreed with the plan and demonstrated an understanding of the instructions. ?  ?The patient was advised to call back or seek an in-person evaluation if the symptoms worsen or if the condition fails to improve as anticipated. ? ?I provided 21 minutes of face-to-face time during this encounter. ? ? ?Jeanie Sewer, NP ?[place] ?[phone] (phone) ?[phone] (fax) ? ?[facility]  ?

## 2021-04-06 ENCOUNTER — Other Ambulatory Visit: Payer: Self-pay | Admitting: Family

## 2021-04-06 DIAGNOSIS — F900 Attention-deficit hyperactivity disorder, predominantly inattentive type: Secondary | ICD-10-CM

## 2021-04-06 MED ORDER — METHYLPHENIDATE HCL ER (OSM) 36 MG PO TBCR: 36.0000 mg | EXTENDED_RELEASE_TABLET | Freq: Every day | ORAL | 0 refills | Status: DC

## 2021-04-09 ENCOUNTER — Ambulatory Visit: Payer: No Typology Code available for payment source | Admitting: Family

## 2021-05-12 ENCOUNTER — Other Ambulatory Visit: Payer: Self-pay | Admitting: Family

## 2021-05-12 DIAGNOSIS — F900 Attention-deficit hyperactivity disorder, predominantly inattentive type: Secondary | ICD-10-CM

## 2021-05-12 MED ORDER — METHYLPHENIDATE HCL ER (OSM) 36 MG PO TBCR: 36.0000 mg | EXTENDED_RELEASE_TABLET | Freq: Every day | ORAL | 0 refills | Status: AC

## 2021-10-12 ENCOUNTER — Encounter: Payer: Self-pay | Admitting: *Deleted

## 2021-12-31 ENCOUNTER — Encounter: Payer: Self-pay | Admitting: *Deleted

## 2022-01-19 ENCOUNTER — Emergency Department (HOSPITAL_BASED_OUTPATIENT_CLINIC_OR_DEPARTMENT_OTHER): Payer: PRIVATE HEALTH INSURANCE

## 2022-01-19 ENCOUNTER — Other Ambulatory Visit: Payer: Self-pay

## 2022-01-19 ENCOUNTER — Encounter (HOSPITAL_BASED_OUTPATIENT_CLINIC_OR_DEPARTMENT_OTHER): Payer: Self-pay

## 2022-01-19 ENCOUNTER — Emergency Department (HOSPITAL_BASED_OUTPATIENT_CLINIC_OR_DEPARTMENT_OTHER)
Admission: EM | Admit: 2022-01-19 | Discharge: 2022-01-19 | Disposition: A | Discharge status: Home / Self Care | Payer: PRIVATE HEALTH INSURANCE | Attending: Emergency Medicine | Admitting: Emergency Medicine

## 2022-01-19 DIAGNOSIS — W228XXA Striking against or struck by other objects, initial encounter: Secondary | ICD-10-CM | POA: Insufficient documentation

## 2022-01-19 DIAGNOSIS — Y990 Civilian activity done for income or pay: Secondary | ICD-10-CM | POA: Diagnosis not present

## 2022-01-19 DIAGNOSIS — S060X0A Concussion without loss of consciousness, initial encounter: Secondary | ICD-10-CM | POA: Insufficient documentation

## 2022-01-19 DIAGNOSIS — S0990XA Unspecified injury of head, initial encounter: Secondary | ICD-10-CM | POA: Diagnosis present

## 2022-01-19 NOTE — ED Provider Notes (Signed)
[facility] EMERGENCY DEPT Provider Note   CSN: [id number] Arrival date & time: 01/19/22  1833     History Chief Complaint  Patient presents with   Concussion    Howard Morris is a 27 y.o. male patient who presents to the emergency department today for further evaluation of headache, blurry vision, and fatigue after striking his head on a shelf.  Patient denies loss of consciousness but he was dazed and was seeing stars after hitting the shelf.  He denies any vomiting, focal weakness or numbness, fever, chills.  HPI     Home Medications Prior to Admission medications   Medication Sig Start Date End Date Taking? Authorizing Provider  hydrOXYzine (ATARAX) 10 MG tablet Take 1 tablet (10 mg total) by mouth 3 (three) times daily as needed. 03/12/21   Jeanie Sewer, NP  methylphenidate (CONCERTA) 36 MG PO CR tablet Take 1 tablet (36 mg total) by mouth daily before breakfast. 05/03/21 06/02/21  Jeanie Sewer, NP  methylphenidate (CONCERTA) 36 MG PO CR tablet Take 1 tablet (36 mg total) by mouth daily before breakfast. 06/02/21 07/02/21  Jeanie Sewer, NP  methylphenidate (CONCERTA) 36 MG PO CR tablet Take 1 tablet (36 mg total) by mouth daily before breakfast. 05/12/21 06/11/21  Jeanie Sewer, NP      Allergies    Patient has no known allergies.    Review of Systems   Review of Systems  All other systems reviewed and are negative.   Physical Exam Updated Vital Signs BP 138/84 (BP Location: Left Arm)   Pulse 80   Temp 98.2 F (36.8 C) (Oral)   Resp 18   SpO2 100%  Physical Exam Vitals and nursing note reviewed.  Constitutional:      Appearance: Normal appearance.  HENT:     Head: Normocephalic and atraumatic.  Eyes:     General:        Right eye: No discharge.        Left eye: No discharge.     Conjunctiva/sclera: Conjunctivae normal.  Pulmonary:     Effort: Pulmonary effort is normal.  Skin:    General: Skin is warm and dry.     Findings: No  rash.  Neurological:     General: No focal deficit present.     Mental Status: He is alert.     Comments: Cranial nerves II through XII are intact.  5/5 strength to the upper and lower extremities.  Normal sensation to the upper and lower extremities.  No dysmetria with finger-to-nose. Speech normal.   Psychiatric:        Mood and Affect: Mood normal.        Behavior: Behavior normal.     ED Results / Procedures / Treatments   Labs (all labs ordered are listed, but only abnormal results are displayed) Labs Reviewed - No data to display  EKG None  Radiology CT Head Wo Contrast  Result Date: 01/19/2022 CLINICAL DATA:  Sudden onset headaches, initial encounter EXAM: CT HEAD WITHOUT CONTRAST TECHNIQUE: Contiguous axial images were obtained from the base of the skull through the vertex without intravenous contrast. RADIATION DOSE REDUCTION: This exam was performed according to the departmental dose-optimization program which includes automated exposure control, adjustment of the mA and/or kV according to patient size and/or use of iterative reconstruction technique. COMPARISON:  None Available. FINDINGS: Brain: No evidence of acute infarction, hemorrhage, hydrocephalus, extra-axial collection or mass lesion/mass effect. Vascular: No hyperdense vessel or unexpected calcification. Skull: Normal. Negative for fracture  or focal lesion. Sinuses/Orbits: No acute finding. Other: None. IMPRESSION: No acute intracranial abnormality noted. Electronically Signed   By: Inez Catalina M.D.   On: 01/19/2022 19:56    Procedures Procedures    Medications Ordered in ED Medications - No data to display  ED Course/ Medical Decision Making/ A&P Clinical Course as of 01/19/22 2149  Tue Jan 19, 2022  2133 CT Head Wo Contrast I personally interpreted the study and do not see any evidence of intracranial hemorrhage or skull fracture.  I do agree with the radiologist interpretation. [CF]    Clinical Course User  Index [CF] Hendricks Limes, PA-C                           Medical Decision Making Howard Morris is a 27 y.o. male patient who presents to the emergency department today for further evaluation of concussion-like symptoms.  CT head was ordered in triage interpreted by myself.  There is no evidence of intracranial hemorrhage.  Likely a concussion.  I educated patient on concussion.  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  I will have him follow-up with his primary care doctor for further evaluation.  Strict turn precautions were discussed.  He is safe for discharge.   Amount and/or Complexity of Data Reviewed Radiology: ordered. Decision-making details documented in ED Course.   Final Clinical Impression(s) / ED Diagnoses Final diagnoses:  Concussion without loss of consciousness, initial encounter    Rx / DC Orders ED Discharge Orders     None         Cherrie Gauze 01/19/22 2149    Regan Lemming, MD 01/19/22 [phone]

## 2022-01-19 NOTE — ED Triage Notes (Signed)
Pt states he was at work, approx 10a, knocked head on shelf, "blacked out after." Endorses HA behind eyes, nauseous, "feeling 20%, brain fog." No neuro deficit noted in triage, pt states he just "wants to make sure it's just a concussion."

## 2022-01-19 NOTE — Discharge Instructions (Addendum)
As we discussed, please keep an eye on your symptoms.  You may take Tylenol or ibuprofen for pain control.  Please drink plenty of fluids and get plenty of rest.  I have provided a work note for you.  I would like for you to see your primary care doctor sometime next week to ensure we have good follow-up.  Return to the emergency department for any worsening symptoms you might have.

## 2022-01-20 ENCOUNTER — Telehealth: Payer: Self-pay

## 2022-01-21 ENCOUNTER — Ambulatory Visit: Payer: No Typology Code available for payment source | Admitting: Family
